# Patient Record
Sex: Female | Born: 1960 | Race: Black or African American | Hispanic: No | Marital: Single | Smoking: Never smoker
Health system: Southern US, Community
[De-identification: ages and names within clinical notes are randomized; demographics above are authoritative.]

## PROBLEM LIST (undated history)

## (undated) DIAGNOSIS — I10 Essential (primary) hypertension: Secondary | ICD-10-CM

## (undated) HISTORY — PX: BREAST EXCISIONAL BIOPSY: SUR124

---

## 2014-05-28 ENCOUNTER — Ambulatory Visit: Payer: Self-pay | Admitting: Gastroenterology

## 2014-08-12 LAB — SURGICAL PATHOLOGY

## 2015-12-11 ENCOUNTER — Other Ambulatory Visit: Payer: Self-pay | Admitting: Obstetrics and Gynecology

## 2015-12-11 DIAGNOSIS — Z1231 Encounter for screening mammogram for malignant neoplasm of breast: Secondary | ICD-10-CM

## 2015-12-29 ENCOUNTER — Ambulatory Visit
Admission: RE | Admit: 2015-12-29 | Discharge: 2015-12-29 | Disposition: A | Discharge status: Home / Self Care | Payer: Commercial Managed Care - HMO | Source: Ambulatory Visit | Attending: Obstetrics and Gynecology | Admitting: Obstetrics and Gynecology

## 2015-12-29 DIAGNOSIS — Z1231 Encounter for screening mammogram for malignant neoplasm of breast: Secondary | ICD-10-CM

## 2016-12-27 ENCOUNTER — Ambulatory Visit: Payer: Commercial Managed Care - HMO | Admitting: Dietician

## 2017-07-15 ENCOUNTER — Other Ambulatory Visit: Payer: Self-pay | Admitting: Obstetrics and Gynecology

## 2017-07-15 DIAGNOSIS — Z1231 Encounter for screening mammogram for malignant neoplasm of breast: Secondary | ICD-10-CM

## 2017-07-20 ENCOUNTER — Ambulatory Visit: Payer: Commercial Managed Care - HMO

## 2017-08-08 ENCOUNTER — Ambulatory Visit
Admission: RE | Admit: 2017-08-08 | Discharge: 2017-08-08 | Disposition: A | Discharge status: Home / Self Care | Payer: 59 | Source: Ambulatory Visit | Attending: Obstetrics and Gynecology | Admitting: Obstetrics and Gynecology

## 2017-08-08 DIAGNOSIS — Z1231 Encounter for screening mammogram for malignant neoplasm of breast: Secondary | ICD-10-CM

## 2018-12-22 ENCOUNTER — Other Ambulatory Visit: Payer: Self-pay

## 2018-12-22 DIAGNOSIS — R609 Edema, unspecified: Secondary | ICD-10-CM | POA: Insufficient documentation

## 2018-12-22 DIAGNOSIS — I10 Essential (primary) hypertension: Secondary | ICD-10-CM | POA: Diagnosis not present

## 2018-12-22 DIAGNOSIS — M7989 Other specified soft tissue disorders: Secondary | ICD-10-CM | POA: Diagnosis present

## 2018-12-23 ENCOUNTER — Emergency Department
Admission: EM | Admit: 2018-12-23 | Discharge: 2018-12-23 | Disposition: A | Discharge status: Home / Self Care | Payer: Managed Care, Other (non HMO) | Attending: Emergency Medicine | Admitting: Emergency Medicine

## 2018-12-23 ENCOUNTER — Emergency Department: Payer: Managed Care, Other (non HMO)

## 2018-12-23 ENCOUNTER — Encounter: Payer: Self-pay | Admitting: Emergency Medicine

## 2018-12-23 ENCOUNTER — Other Ambulatory Visit: Payer: Self-pay

## 2018-12-23 DIAGNOSIS — R609 Edema, unspecified: Secondary | ICD-10-CM

## 2018-12-23 HISTORY — DX: Essential (primary) hypertension: I10

## 2018-12-23 LAB — BASIC METABOLIC PANEL
Anion gap: 8 (ref 5–15)
BUN: 12 mg/dL (ref 6–20)
CO2: 27 mmol/L (ref 22–32)
Calcium: 9.1 mg/dL (ref 8.9–10.3)
Chloride: 103 mmol/L (ref 98–111)
Creatinine, Ser: 0.91 mg/dL (ref 0.44–1.00)
GFR calc Af Amer: >60 mL/min (ref >60)
GFR calc non Af Amer: >60 mL/min (ref >60)
Glucose, Bld: 99 mg/dL (ref 70–99)
Potassium: 3.4 mmol/L — ABNORMAL LOW (ref 3.5–5.1)
Sodium: 138 mmol/L (ref 135–145)

## 2018-12-23 LAB — CBC
HCT: 38.6 % (ref 36.0–46.0)
Hemoglobin: 12.2 g/dL (ref 12.0–15.0)
MCH: 25.8 pg — ABNORMAL LOW (ref 26.0–34.0)
MCHC: 31.6 g/dL (ref 30.0–36.0)
MCV: 81.6 fL (ref 80.0–100.0)
Platelets: 226 10*3/uL (ref 150–400)
RBC: 4.73 MIL/uL (ref 3.87–5.11)
RDW: 14 % (ref 11.5–15.5)
WBC: 4.1 10*3/uL (ref 4.0–10.5)
nRBC: 0 % (ref 0.0–0.2)

## 2018-12-23 NOTE — ED Triage Notes (Signed)
Pt reports noticed swelling to left leg around 5pm, reports pain to foot on same leg, Pt ambulatory to triage room with steady gatt pt talks in complete sentences no respiratory distress noted

## 2018-12-23 NOTE — ED Notes (Signed)
Pt in ultrasound

## 2018-12-23 NOTE — ED Provider Notes (Signed)
[facility] Emergency Department Provider Note  ____________________________________________  Time seen: Approximately 5:18 AM  I have reviewed the triage vital signs and the nursing notes.   HISTORY  Chief Complaint Leg Swelling   HPI Allison Little is a 58 y.o. female with a history of hypertension presents for evaluation of leg swelling.  Patient reports swelling of her left lower extremity which started today.  She reports several days ago her grandson dropped a toy on her left foot but she denied any swelling into this evening.  She has no pain.  No prior history of PE or DVT, no chest pain or shortness of breath, no recent travel immobilization, no hemoptysis or exogenous hormones.  Patient reports that a month ago she was started on amlodipine and losartan for high blood pressure.  No other new medications.  No history of CHF.   Past Medical History:  Diagnosis Date   Hypertension     Past Surgical History:  Procedure Laterality Date   BREAST EXCISIONAL BIOPSY Right    benign    Prior to Admission medications   Not on File    Allergies Lisinopril  No family history on file.  Social History Social History   Tobacco Use   Smoking status: Never Smoker  Substance Use Topics   Alcohol use: Not on file   Drug use: Not on file    Review of Systems  Constitutional: Negative for fever. Eyes: Negative for visual changes. ENT: Negative for sore throat. Neck: No neck pain  Cardiovascular: Negative for chest pain. Respiratory: Negative for shortness of breath. Gastrointestinal: Negative for abdominal pain, vomiting or diarrhea. Genitourinary: Negative for dysuria. Musculoskeletal: Negative for back pain. + left leg swelling Skin: Negative for rash. Neurological: Negative for headaches, weakness or numbness. Psych: No SI or HI  ____________________________________________   PHYSICAL EXAM:  VITAL SIGNS: ED Triage  Vitals  Enc Vitals Group     BP 12/23/18 0014 (!) 155/80     Pulse Rate 12/23/18 0014 62     Resp 12/23/18 0014 20     Temp 12/23/18 0014 98.1 F (36.7 C)     Temp Source 12/23/18 0014 Oral     SpO2 12/23/18 0014 97 %     Weight 12/23/18 0015 265 lb (120.2 kg)     Height 12/23/18 0015 5\' 6"  (1.676 m)     Head Circumference --      Peak Flow --      Pain Score 12/23/18 0015 0     Pain Loc --      Pain Edu? --      Excl. in [place]? --     Constitutional: Alert and oriented. Well appearing and in no apparent distress. HEENT:      Head: Normocephalic and atraumatic.         Eyes: Conjunctivae are normal. Sclera is non-icteric.       Mouth/Throat: Mucous membranes are moist.       Neck: Supple with no signs of meningismus. Cardiovascular: Regular rate and rhythm. No murmurs, gallops, or rubs. 2+ symmetrical distal pulses are present in all extremities. No JVD. Respiratory: Normal respiratory effort. Lungs are clear to auscultation bilaterally. No wheezes, crackles, or rhonchi.  Gastrointestinal: Soft, non tender, and non distended with positive bowel sounds. No rebound or guarding. Musculoskeletal: Trace pitting edema on b/l LE with L>R Neurologic: Normal speech and language. Face is symmetric. Moving all extremities. No gross focal neurologic deficits are appreciated. Skin:  Skin is warm, dry and intact. No rash noted. Psychiatric: Mood and affect are normal. Speech and behavior are normal.  ____________________________________________   LABS (all labs ordered are listed, but only abnormal results are displayed)  Labs Reviewed  CBC - Abnormal; Notable for the following components:      Result Value   MCH 25.8 (*)    All other components within normal limits  BASIC METABOLIC PANEL - Abnormal; Notable for the following components:   Potassium 3.4 (*)    All other components within normal limits   ____________________________________________  EKG  none    ____________________________________________  RADIOLOGY  I have personally reviewed the images performed during this visit and I agree with the Radiologist's read.   Interpretation by Radiologist:  US Venous Img Lower Unilateral Left  Result Date: 12/23/2018 CLINICAL DATA:  Initial evaluation for acute swelling. EXAM: Left LOWER EXTREMITY VENOUS DOPPLER ULTRASOUND TECHNIQUE: Gray-scale sonography with graded compression, as well as color Doppler and duplex ultrasound were performed to evaluate the lower extremity deep venous systems from the level of the common femoral vein and including the common femoral, femoral, profunda femoral, popliteal and calf veins including the posterior tibial, peroneal and gastrocnemius veins when visible. The superficial great saphenous vein was also interrogated. Spectral Doppler was utilized to evaluate flow at rest and with distal augmentation maneuvers in the common femoral, femoral and popliteal veins. COMPARISON:  None. FINDINGS: Contralateral Common Femoral Vein: Respiratory phasicity is normal and symmetric with the symptomatic side. No evidence of thrombus. Normal compressibility. Common Femoral Vein: No evidence of thrombus. Normal compressibility, respiratory phasicity and response to augmentation. Saphenofemoral Junction: No evidence of thrombus. Normal compressibility and flow on color Doppler imaging. Profunda Femoral Vein: No evidence of thrombus. Normal compressibility and flow on color Doppler imaging. Femoral Vein: No evidence of thrombus. Normal compressibility, respiratory phasicity and response to augmentation. Popliteal Vein: No evidence of thrombus. Normal compressibility, respiratory phasicity and response to augmentation. Calf Veins: No evidence of thrombus. Normal compressibility and flow on color Doppler imaging. Superficial Great Saphenous Vein: No evidence of thrombus. Normal compressibility. Venous Reflux:  None. Other Findings:  None.  IMPRESSION: No evidence of deep venous thrombosis. Electronically Signed   By: Rise Mu M.D.   On: 12/23/2018 06:26      ____________________________________________   PROCEDURES  Procedure(s) performed: None Procedures Critical Care performed:  None ____________________________________________   INITIAL IMPRESSION / ASSESSMENT AND PLAN / ED COURSE  58 y.o. female with a history of hypertension presents for evaluation of leg swelling.  Patient with asymmetric leg swelling with left greater than right.  Extremities warm and well-perfused with strong distal pulses, no erythema or warmth, no crepitus, no deformities, no tenderness palpation.  Doppler ultrasound pending. Ddx DVT, venous stasis, side effect of BP medications    _________________________ 6:31 AM on 12/23/2018 -----------------------------------------  Labs WNL. [place] negative for DVT. Recommended elevation, compression stockings and f/u with PCP. Discussed my standard return precautions.     As part of my medical decision making, I reviewed the following data within the electronic MEDICAL RECORD NUMBER Nursing notes reviewed and incorporated, Labs reviewed , Old chart reviewed, Radiograph reviewed , Notes from prior ED visits and [place] Controlled Substance Database   Patient was evaluated in Emergency Department today for the symptoms described in the history of present illness. Patient was evaluated in the context of the global COVID-19 pandemic, which necessitated consideration that the patient might be at risk for infection with the SARS-CoV-2  virus that causes COVID-19. Institutional protocols and algorithms that pertain to the evaluation of patients at risk for COVID-19 are in a state of rapid change based on information released by regulatory bodies including the CDC and federal and state organizations. These policies and algorithms were followed during the patient's care in the  ED.   ____________________________________________   FINAL CLINICAL IMPRESSION(S) / ED DIAGNOSES   Final diagnoses:  Peripheral edema      NEW MEDICATIONS STARTED DURING THIS VISIT:  ED Discharge Orders    None       Note:  This document was prepared using Dragon voice recognition software and may include unintentional dictation errors.    Don PerkingVeronese, [place], MD 12/23/18 [phone]

## 2018-12-23 NOTE — Discharge Instructions (Signed)
Elevate, use compression stockings, follow up with your doctor. Return to the ER if the swelling is getting worse, if you notice redness or pain in your leg.

## 2018-12-23 NOTE — ED Notes (Signed)
E-signature not working at this time. Pt verbalizes understanding of D/C instructions with no further questions at this time. Pt in NAD at time of D/C. Pt ambulatory to lobby at this time.

## 2019-05-15 ENCOUNTER — Other Ambulatory Visit: Payer: Self-pay | Admitting: Obstetrics and Gynecology

## 2019-05-15 DIAGNOSIS — Z1231 Encounter for screening mammogram for malignant neoplasm of breast: Secondary | ICD-10-CM

## 2019-06-25 ENCOUNTER — Ambulatory Visit: Payer: Managed Care, Other (non HMO)

## 2020-08-11 ENCOUNTER — Other Ambulatory Visit: Payer: Self-pay | Admitting: Obstetrics and Gynecology

## 2020-08-11 DIAGNOSIS — Z1231 Encounter for screening mammogram for malignant neoplasm of breast: Secondary | ICD-10-CM

## 2020-09-30 ENCOUNTER — Ambulatory Visit
Admission: RE | Admit: 2020-09-30 | Discharge: 2020-09-30 | Disposition: A | Discharge status: Home / Self Care | Payer: Managed Care, Other (non HMO) | Source: Ambulatory Visit | Attending: Obstetrics and Gynecology | Admitting: Obstetrics and Gynecology

## 2020-09-30 ENCOUNTER — Other Ambulatory Visit: Payer: Self-pay

## 2020-09-30 DIAGNOSIS — Z1231 Encounter for screening mammogram for malignant neoplasm of breast: Secondary | ICD-10-CM

## 2021-08-07 ENCOUNTER — Other Ambulatory Visit: Payer: Self-pay | Admitting: Obstetrics and Gynecology

## 2021-08-07 DIAGNOSIS — Z1231 Encounter for screening mammogram for malignant neoplasm of breast: Secondary | ICD-10-CM

## 2021-10-02 ENCOUNTER — Ambulatory Visit
Admission: RE | Admit: 2021-10-02 | Discharge: 2021-10-02 | Disposition: A | Discharge status: Home / Self Care | Payer: Managed Care, Other (non HMO) | Source: Ambulatory Visit | Attending: Obstetrics and Gynecology | Admitting: Obstetrics and Gynecology

## 2021-10-02 DIAGNOSIS — Z1231 Encounter for screening mammogram for malignant neoplasm of breast: Secondary | ICD-10-CM

## 2022-08-11 ENCOUNTER — Other Ambulatory Visit: Payer: Self-pay | Admitting: Obstetrics and Gynecology

## 2022-08-11 DIAGNOSIS — Z1231 Encounter for screening mammogram for malignant neoplasm of breast: Secondary | ICD-10-CM

## 2022-10-05 ENCOUNTER — Ambulatory Visit: Payer: Managed Care, Other (non HMO)

## 2022-10-05 ENCOUNTER — Ambulatory Visit
Admission: RE | Admit: 2022-10-05 | Discharge: 2022-10-05 | Disposition: A | Discharge status: Home / Self Care | Payer: 59 | Source: Ambulatory Visit | Attending: Obstetrics and Gynecology | Admitting: Obstetrics and Gynecology

## 2022-10-05 DIAGNOSIS — Z1231 Encounter for screening mammogram for malignant neoplasm of breast: Secondary | ICD-10-CM

## 2022-12-20 IMAGING — MG MM DIGITAL SCREENING BILAT W/ TOMO AND CAD
8 series · 8 of 24 positions shown · non-contrast
Comparison: Previous exam(s).

CLINICAL DATA: Screening.

EXAM:
DIGITAL SCREENING BILATERAL MAMMOGRAM WITH TOMOSYNTHESIS AND CAD
TECHNIQUE: Bilateral screening digital craniocaudal and mediolateral oblique
mammograms were obtained. Bilateral screening digital breast
tomosynthesis was performed. The images were evaluated with
computer-aided detection.

[L CC synth-2D]
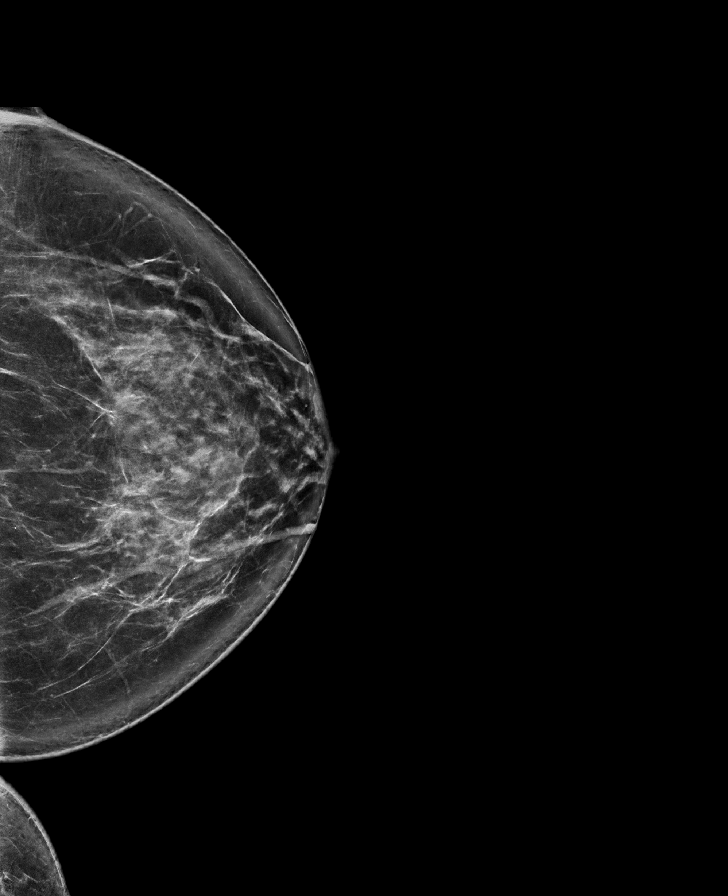

[R MLO synth-2D]
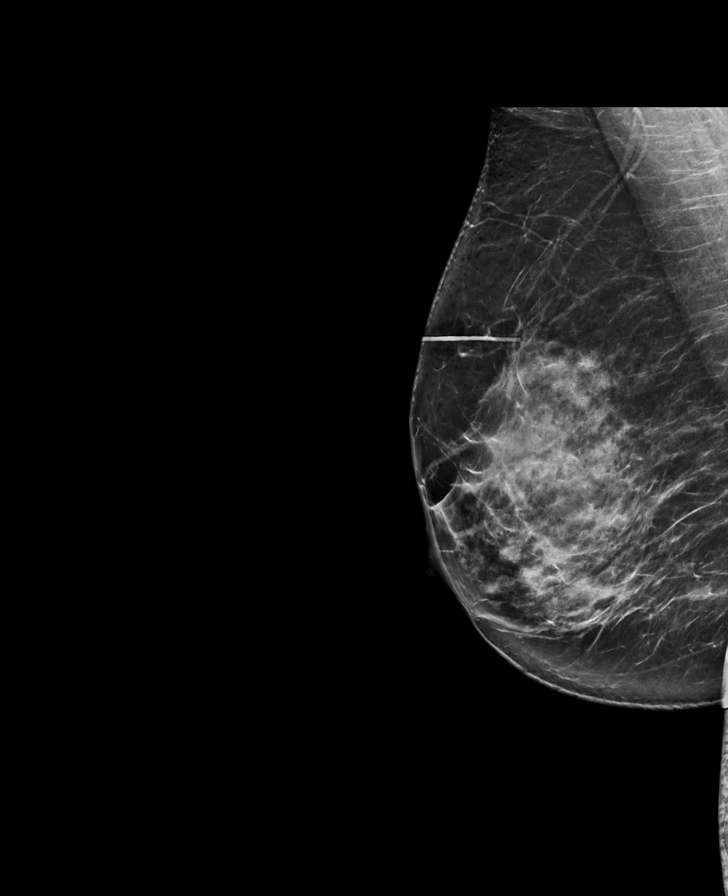

[R CC synth-2D]
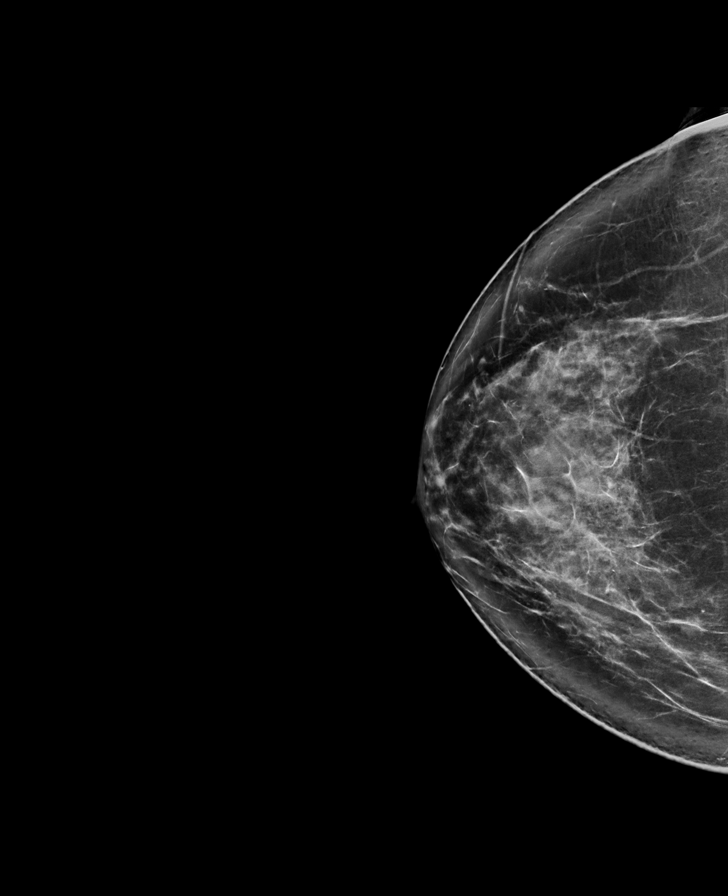

[L MLO synth-2D]
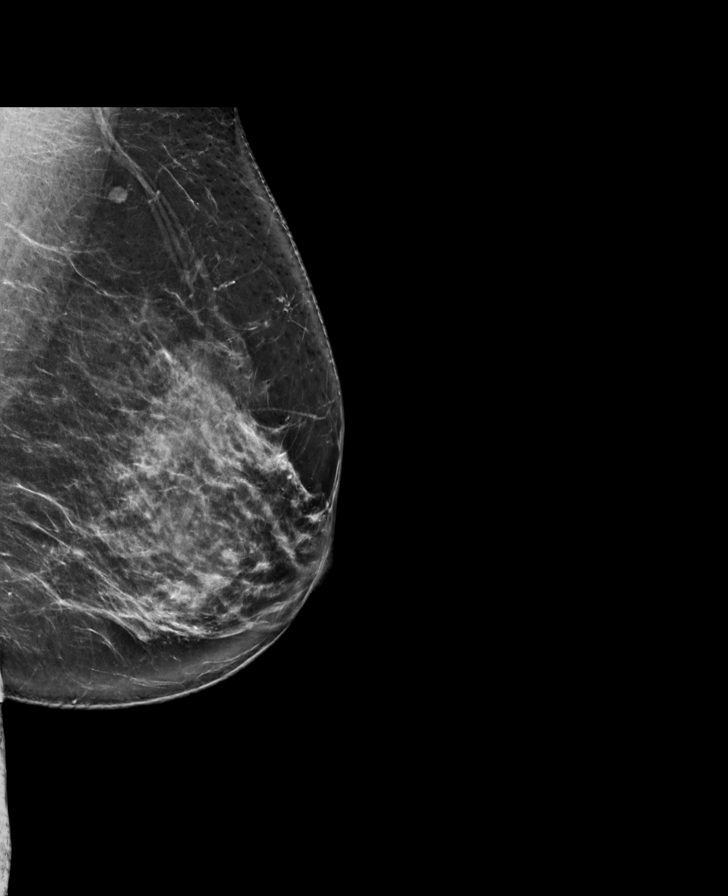

[L MLO tomo · tomo slice 46/91.0]
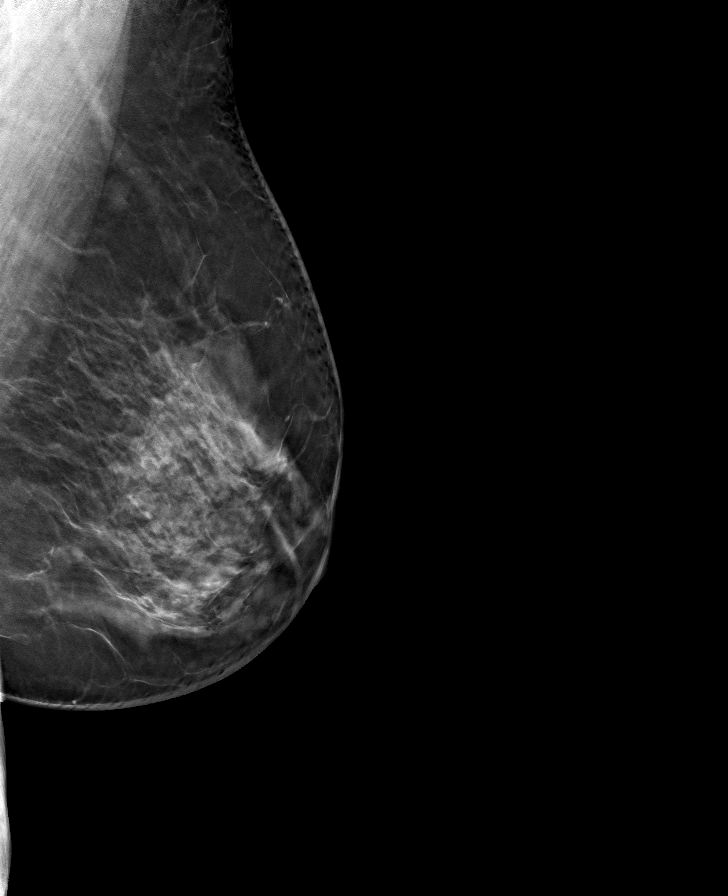

[L CC tomo · tomo slice 45/88.0]
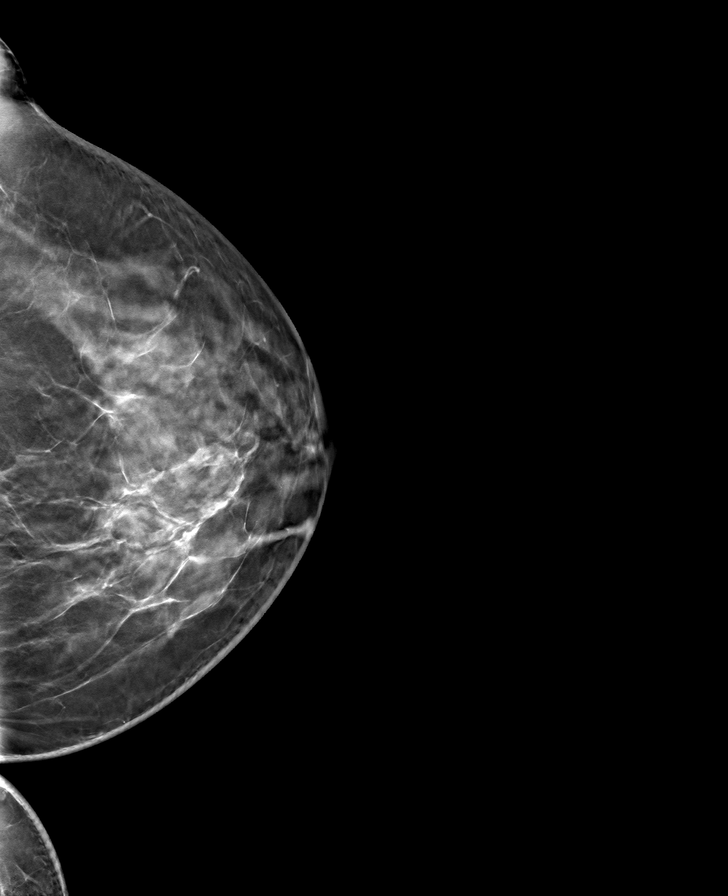

[R MLO tomo · tomo slice 45/88.0]
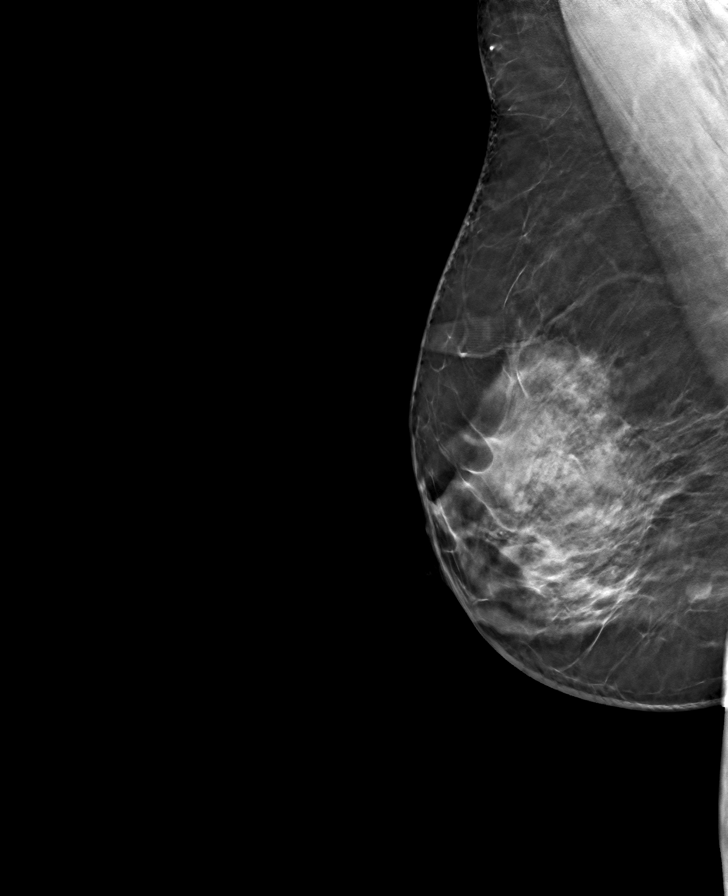

[R CC tomo · tomo slice 47/92.0]
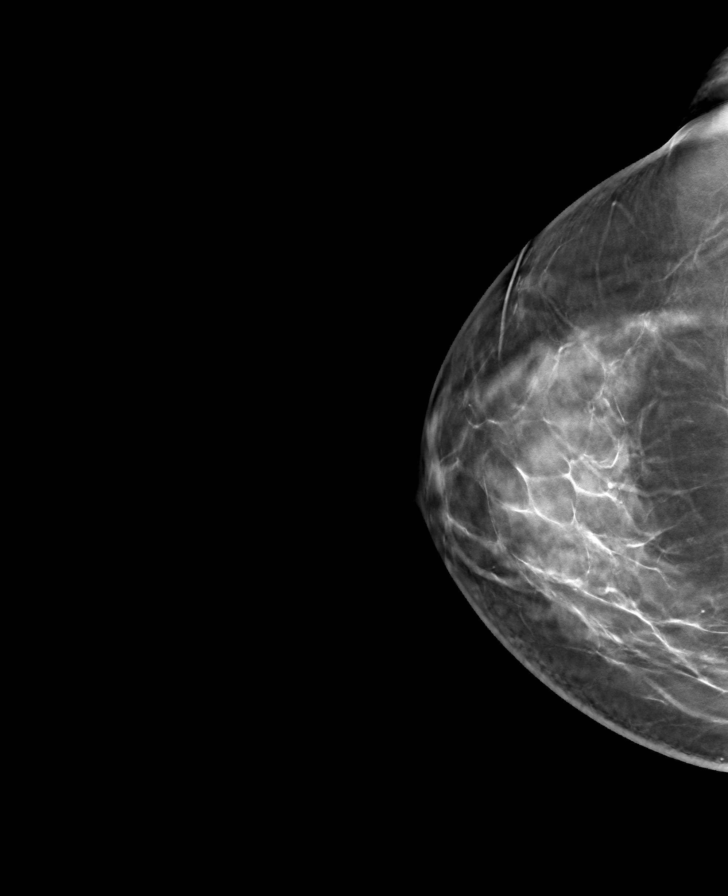

[8 of 24 positions shown; findings below may reference images not displayed]

ACR Breast Density Category c: The breast tissue is heterogeneously
dense, which may obscure small masses.
FINDINGS: There are no findings suspicious for malignancy.
IMPRESSION: No mammographic evidence of malignancy. A result letter of this
screening mammogram will be mailed directly to the patient.

RECOMMENDATION:
Screening mammogram in one year. (Code:Q3-W-BC3)

BI-RADS CATEGORY  1: Negative.

## 2024-02-09 ENCOUNTER — Other Ambulatory Visit: Payer: Self-pay | Admitting: Family

## 2024-02-09 DIAGNOSIS — Z1231 Encounter for screening mammogram for malignant neoplasm of breast: Secondary | ICD-10-CM

## 2024-02-13 ENCOUNTER — Encounter: Payer: Self-pay | Admitting: Family

## 2024-02-22 ENCOUNTER — Ambulatory Visit
Admission: RE | Admit: 2024-02-22 | Discharge: 2024-02-22 | Disposition: A | Discharge status: Home / Self Care | Source: Ambulatory Visit | Attending: Family | Admitting: Family

## 2024-02-22 DIAGNOSIS — Z1231 Encounter for screening mammogram for malignant neoplasm of breast: Secondary | ICD-10-CM
